# Patient Record
Sex: Female | Born: 2000 | Race: Black or African American | Hispanic: No | Marital: Single | Smoking: Never smoker
Health system: Southern US, Community
[De-identification: ages and names within clinical notes are randomized; demographics above are authoritative.]

---

## 2020-10-11 ENCOUNTER — Encounter (HOSPITAL_BASED_OUTPATIENT_CLINIC_OR_DEPARTMENT_OTHER): Payer: Self-pay | Admitting: Emergency Medicine

## 2020-10-11 ENCOUNTER — Emergency Department (HOSPITAL_BASED_OUTPATIENT_CLINIC_OR_DEPARTMENT_OTHER)
Admission: EM | Admit: 2020-10-11 | Discharge: 2020-10-11 | Disposition: A | Discharge status: Home / Self Care | Payer: Medicaid Other | Attending: Emergency Medicine | Admitting: Emergency Medicine

## 2020-10-11 ENCOUNTER — Emergency Department (HOSPITAL_BASED_OUTPATIENT_CLINIC_OR_DEPARTMENT_OTHER): Payer: Medicaid Other

## 2020-10-11 DIAGNOSIS — S70312A Abrasion, left thigh, initial encounter: Secondary | ICD-10-CM | POA: Insufficient documentation

## 2020-10-11 DIAGNOSIS — S50812A Abrasion of left forearm, initial encounter: Secondary | ICD-10-CM | POA: Insufficient documentation

## 2020-10-11 DIAGNOSIS — S61205A Unspecified open wound of left ring finger without damage to nail, initial encounter: Secondary | ICD-10-CM | POA: Insufficient documentation

## 2020-10-11 DIAGNOSIS — Z23 Encounter for immunization: Secondary | ICD-10-CM | POA: Diagnosis not present

## 2020-10-11 DIAGNOSIS — T07XXXA Unspecified multiple injuries, initial encounter: Secondary | ICD-10-CM

## 2020-10-11 DIAGNOSIS — W3400XA Accidental discharge from unspecified firearms or gun, initial encounter: Secondary | ICD-10-CM | POA: Insufficient documentation

## 2020-10-11 DIAGNOSIS — S6992XA Unspecified injury of left wrist, hand and finger(s), initial encounter: Secondary | ICD-10-CM | POA: Diagnosis present

## 2020-10-11 DIAGNOSIS — S61235A Puncture wound without foreign body of left ring finger without damage to nail, initial encounter: Secondary | ICD-10-CM

## 2020-10-11 LAB — PREGNANCY, URINE: Preg Test, Ur: NEGATIVE

## 2020-10-11 MED ORDER — CEFAZOLIN SODIUM 1 G IJ SOLR
1.0000 g | Freq: Once | INTRAMUSCULAR | Status: AC
  Administered 2020-10-11: 1 g via INTRAMUSCULAR
  Filled 2020-10-11: qty 10

## 2020-10-11 MED ORDER — KETOROLAC TROMETHAMINE 60 MG/2ML IM SOLN
60.0000 mg | Freq: Once | INTRAMUSCULAR | Status: AC
  Administered 2020-10-11: 60 mg via INTRAMUSCULAR
  Filled 2020-10-11: qty 2

## 2020-10-11 MED ORDER — DOXYCYCLINE HYCLATE 100 MG PO CAPS: 100.0000 mg | ORAL_CAPSULE | Freq: Two times a day (BID) | ORAL | 0 refills | Status: AC

## 2020-10-11 MED ORDER — MELOXICAM 15 MG PO TABS: 15.0000 mg | ORAL_TABLET | Freq: Every day | ORAL | 0 refills | Status: AC

## 2020-10-11 MED ORDER — MUPIROCIN 2 % EX OINT: 1.0000 "application " | TOPICAL_OINTMENT | Freq: Two times a day (BID) | CUTANEOUS | 0 refills | Status: AC

## 2020-10-11 MED ORDER — TETANUS-DIPHTH-ACELL PERTUSSIS 5-2.5-18.5 LF-MCG/0.5 IM SUSY
0.5000 mL | PREFILLED_SYRINGE | Freq: Once | INTRAMUSCULAR | Status: AC
  Administered 2020-10-11: 0.5 mL via INTRAMUSCULAR
  Filled 2020-10-11: qty 0.5

## 2020-10-11 MED ORDER — CEPHALEXIN 500 MG PO CAPS: 500.0000 mg | ORAL_CAPSULE | Freq: Four times a day (QID) | ORAL | 0 refills | Status: AC
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
## 2020-10-11 NOTE — ED Notes (Signed)
[place] [employer] at bedside.
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
